# Patient Record
Sex: Male | Born: 1985
Health system: Southern US, Community
[De-identification: ages and names within clinical notes are randomized; demographics above are authoritative.]

## PROBLEM LIST (undated history)

## (undated) DIAGNOSIS — F321 Major depressive disorder, single episode, moderate: Secondary | ICD-10-CM

## (undated) HISTORY — DX: Major depressive disorder, single episode, moderate: F32.1

---

## 2008-09-15 HISTORY — PX: MANDIBLE FRACTURE SURGERY: SHX706

## 2009-02-21 ENCOUNTER — Emergency Department: Payer: Self-pay | Admitting: Emergency Medicine

## 2010-07-03 IMAGING — CT CT CERVICAL SPINE WITHOUT CONTRAST
1 series · 12 of 14 positions shown, 15 images · non-contrast
Comparison: none

REASON FOR EXAM: neck pain  syncope
COMMENTS:

[Series 5: axial · axial · 0.24mm/px · z∈[-265,-137]mm · 12 of 76 slices shown, 15 images]
[im 6/76  soft-tissue]
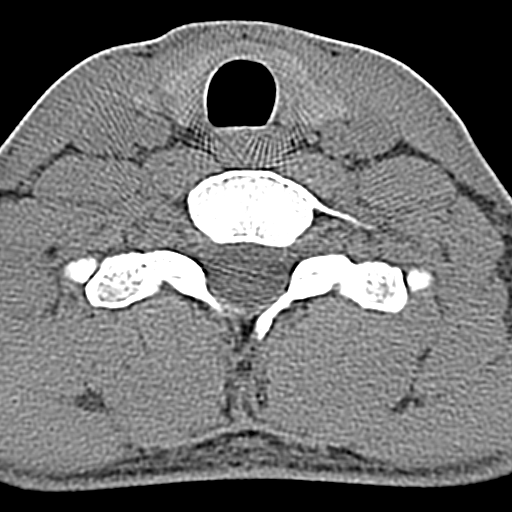
[im 6/76  bone]
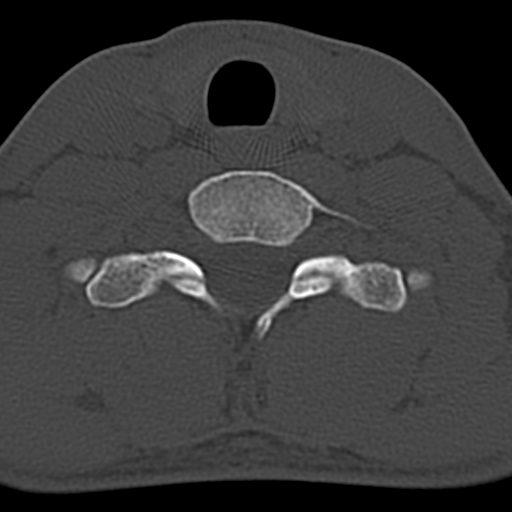
[im 12/76  bone]
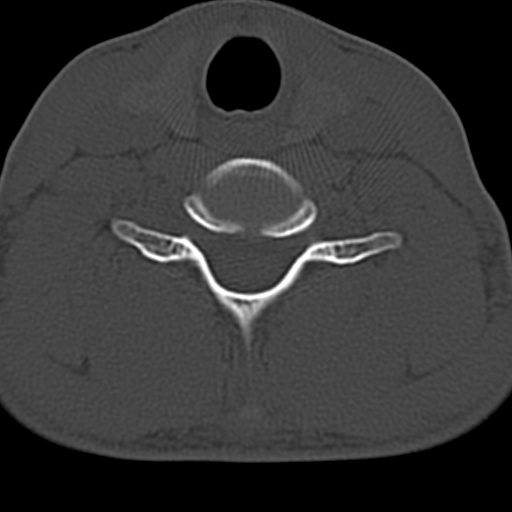
[im 18/76  bone]
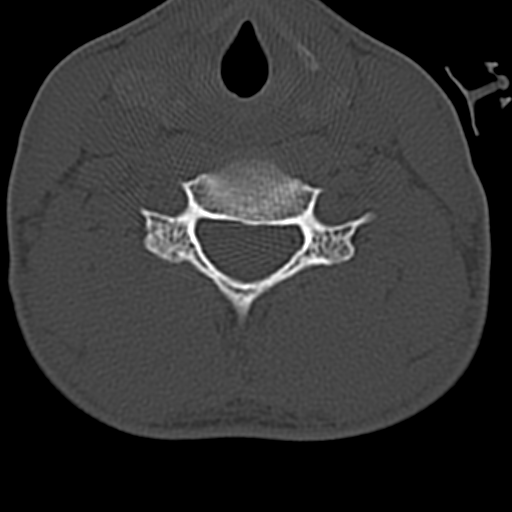
[im 24/76  bone]
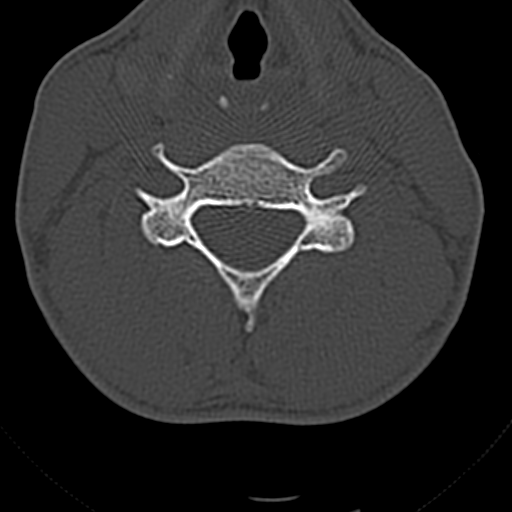
[im 29/76  soft-tissue]
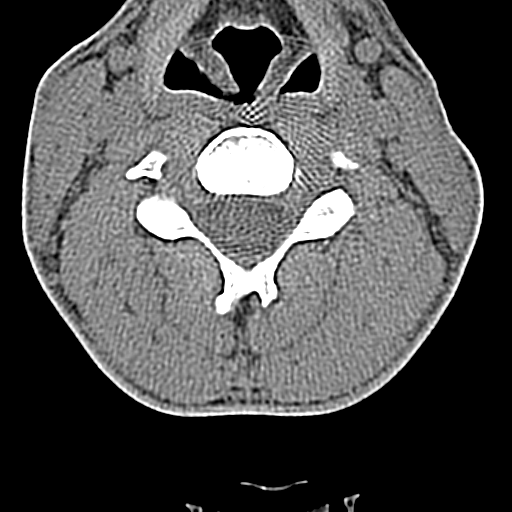
[im 29/76  bone]
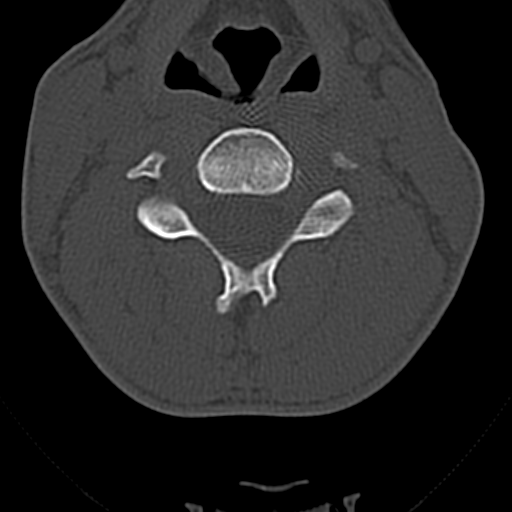
[im 35/76  bone]
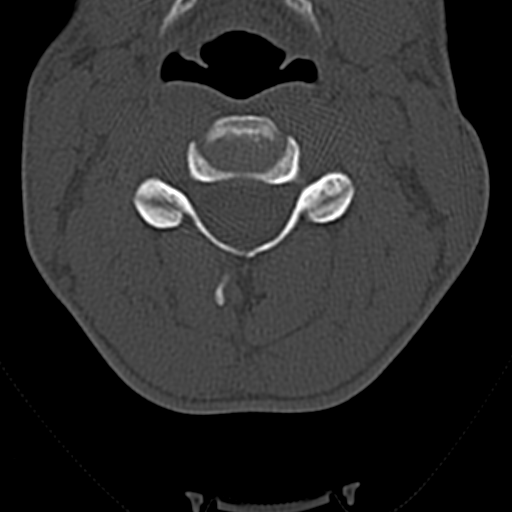
[im 41/76  bone]
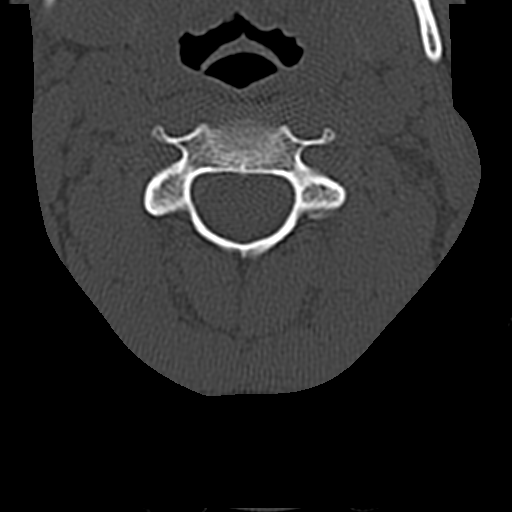
[im 47/76  bone]
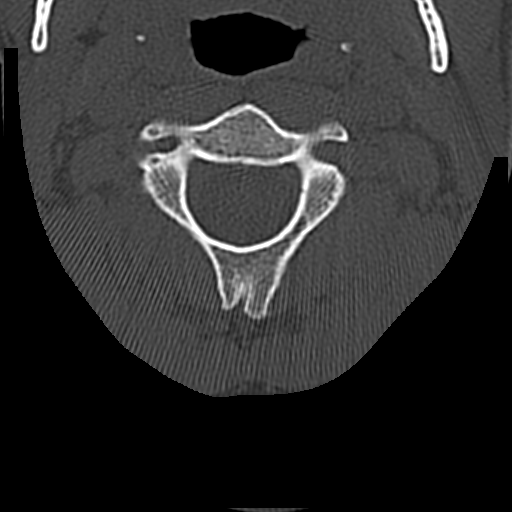
[im 52/76  soft-tissue]
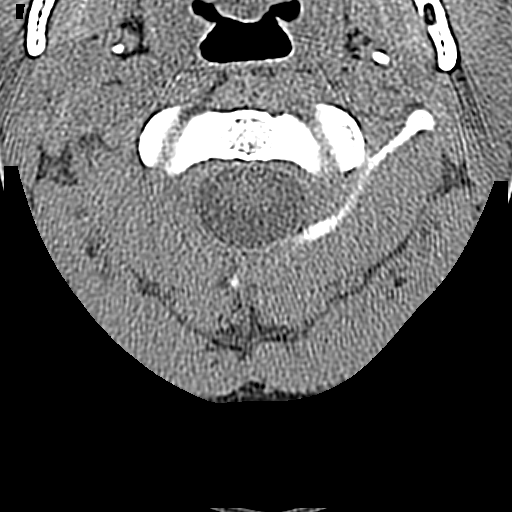
[im 52/76  bone]
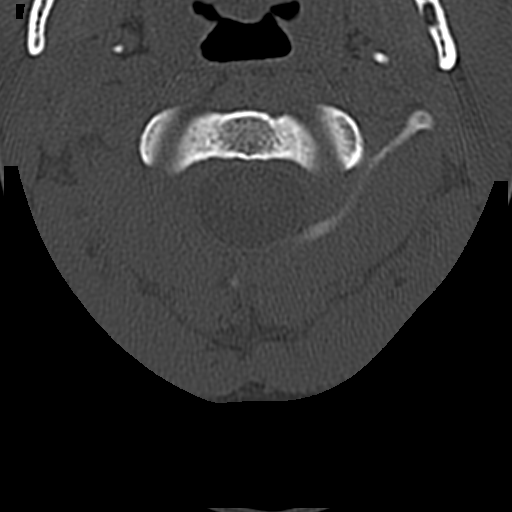
[im 58/76  bone]
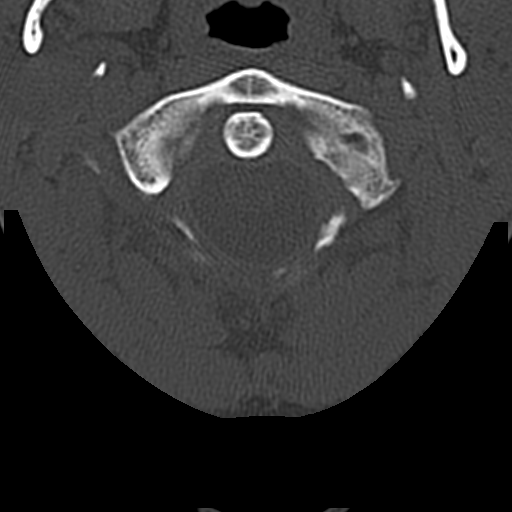
[im 64/76  bone]
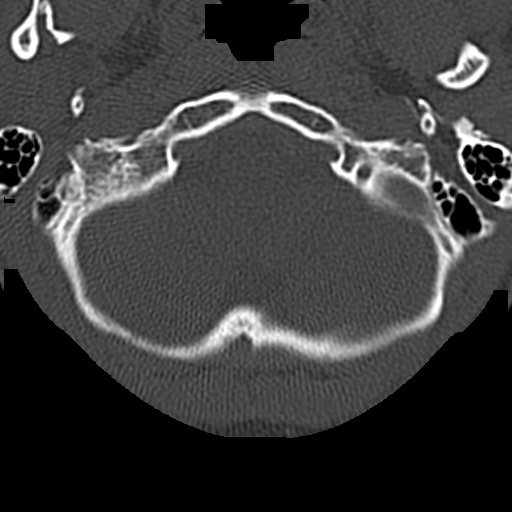
[im 70/76  bone]
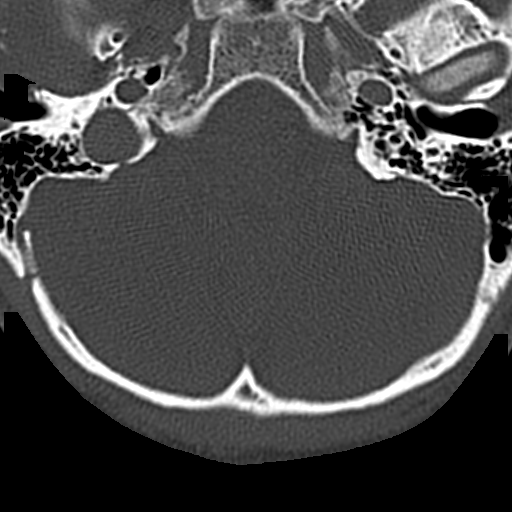

[12 of 14 positions shown; findings below may reference images not displayed]

PROCEDURE:     CT  - CT CERVICAL SPINE WO  - February 21, 2009  [DATE]

RESULT:     Multiplanar imaging of the cervical spine is obtained utilizing
helical, 2 mm sections.

There is straightening of the normal cervical lordosis likely secondary to
collar placement, muscle spasm and/or positioning. There is no evidence of
fracture, dislocation or malalignment. Note, there is incomplete fusion of
the posterior ring of C1. The borders are corticated and this is either
congenital or chronic post traumatic. There is no evidence of prevertebral
soft tissue swelling.
IMPRESSION: 1.     No evidence of acute osseous abnormalities.
2.     Dr. Gaston Antonio of the Emergency Department was informed of these
findings at the time of the initial interpretation.

## 2010-07-03 IMAGING — CT CT HEAD WITHOUT CONTRAST
1 series · 16 of 30 positions shown, 20 images · non-contrast
Comparison: none

REASON FOR EXAM: syncope
COMMENTS:

[Series 2: soft tissue · axial · 0.43mm/px · z∈[-154,-19]mm · 16 of 30 slices shown, 20 images]
[im 2/30  brain]
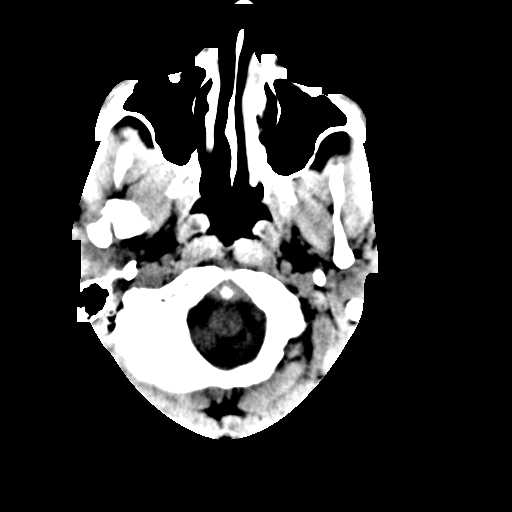
[im 2/30  bone]
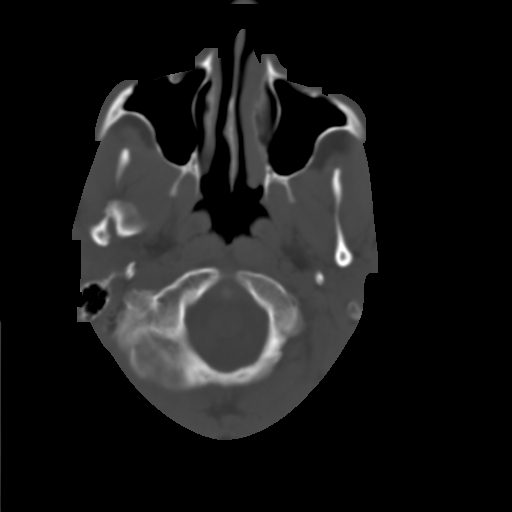
[im 4/30  brain]
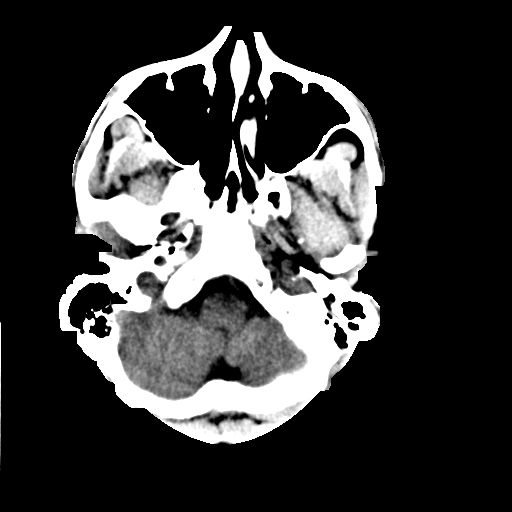
[im 6/30  brain]
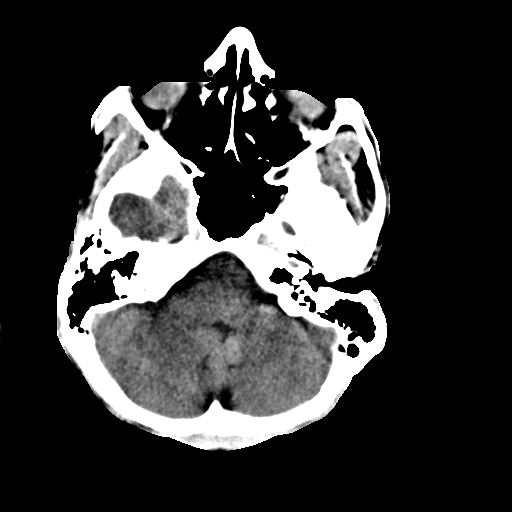
[im 8/30  brain]
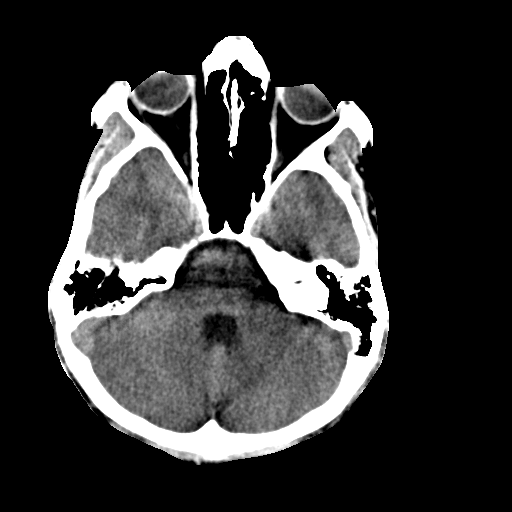
[im 9/30  brain]
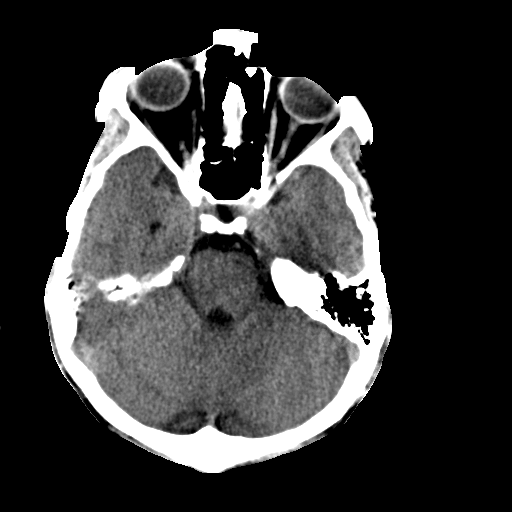
[im 9/30  bone]
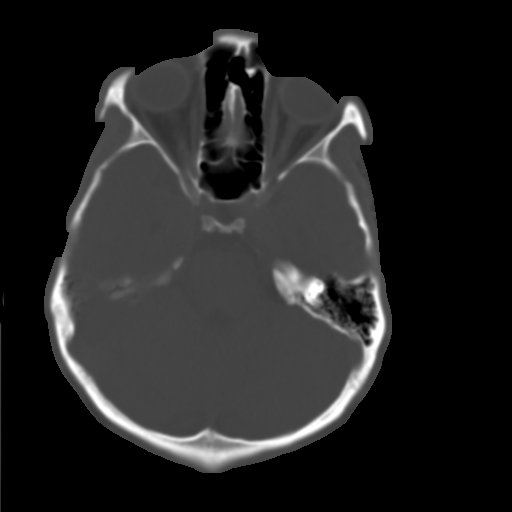
[im 11/30  brain]
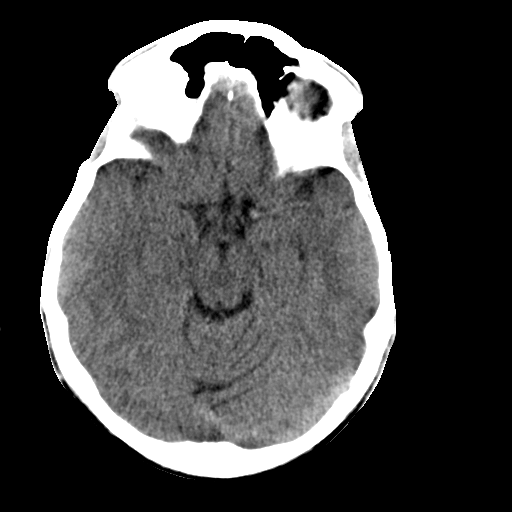
[im 13/30  brain]
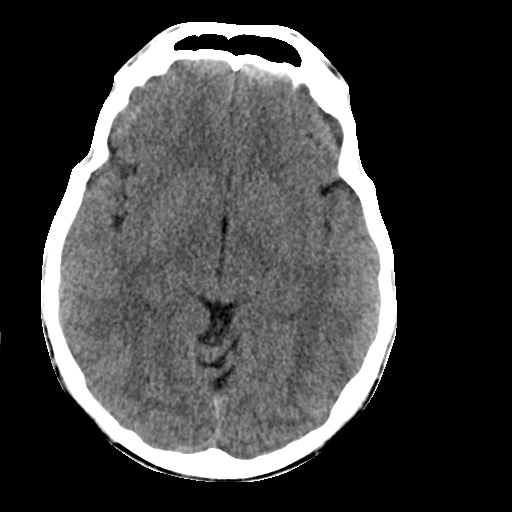
[im 15/30  brain]
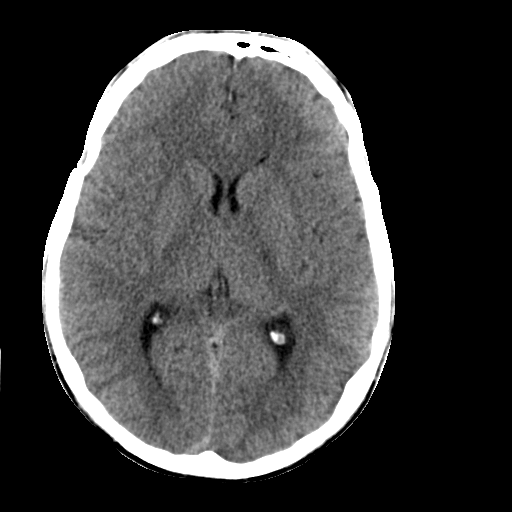
[im 16/30  brain]
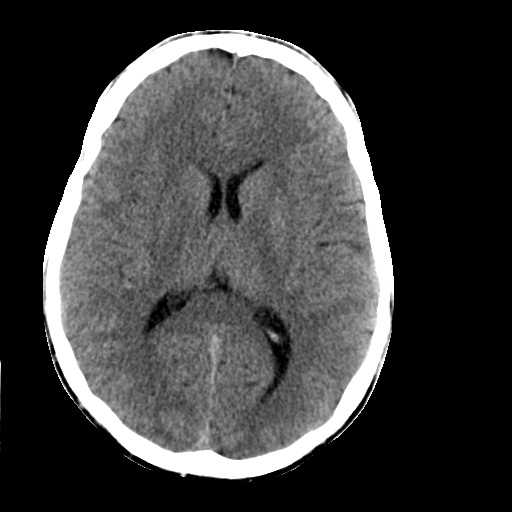
[im 16/30  bone]
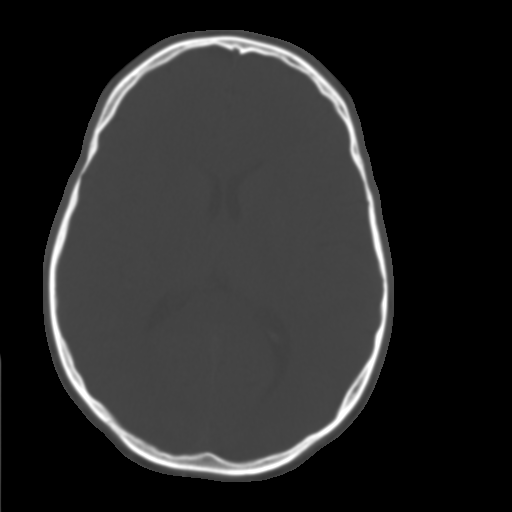
[im 18/30  brain]
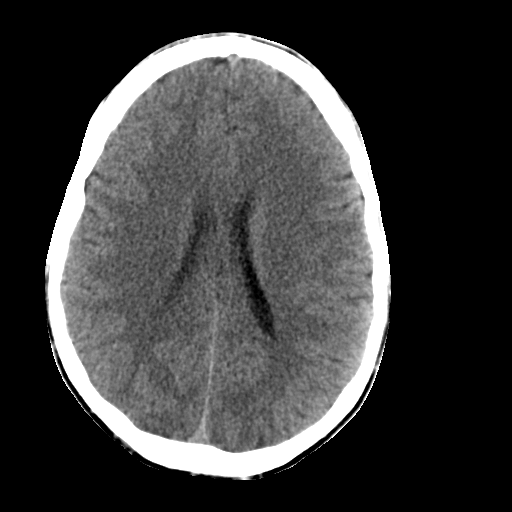
[im 20/30  brain]
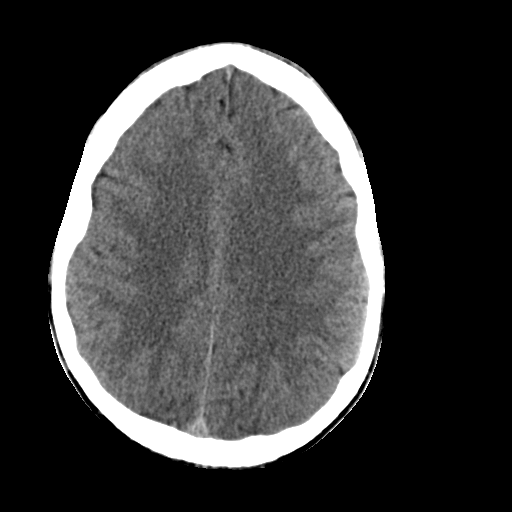
[im 22/30  brain]
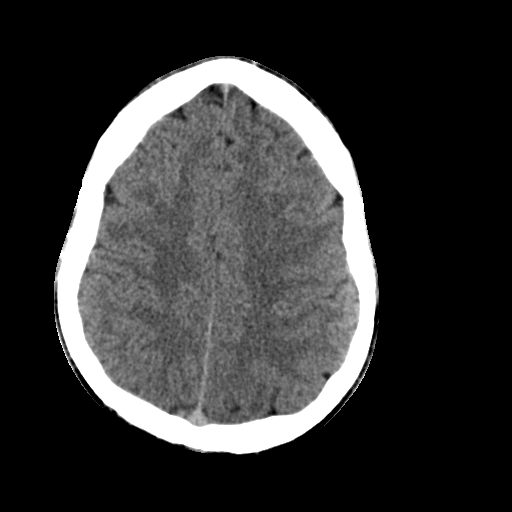
[im 23/30  brain]
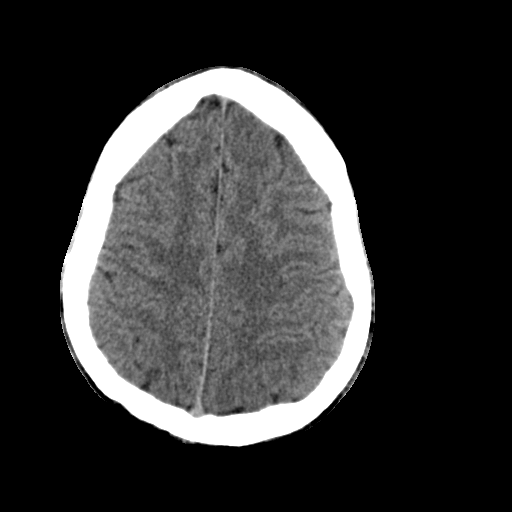
[im 23/30  bone]
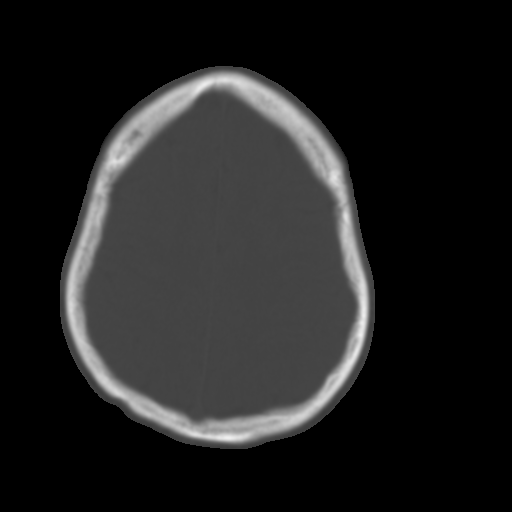
[im 25/30  brain]
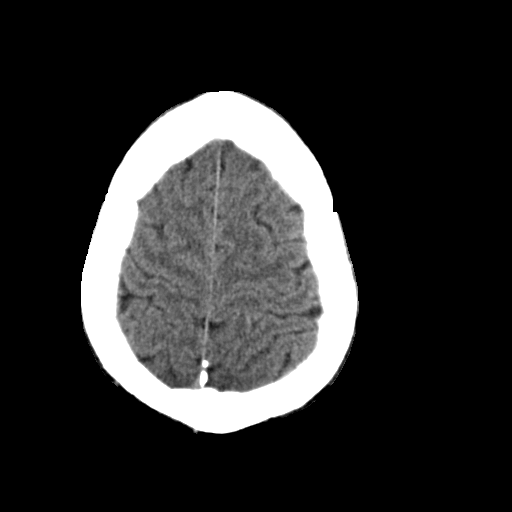
[im 27/30  brain]
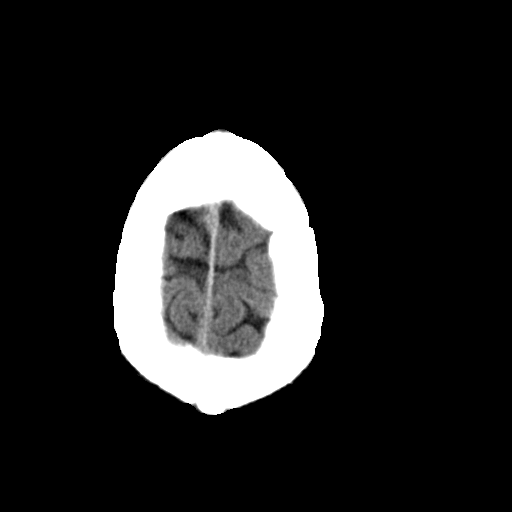
[im 29/30  brain]
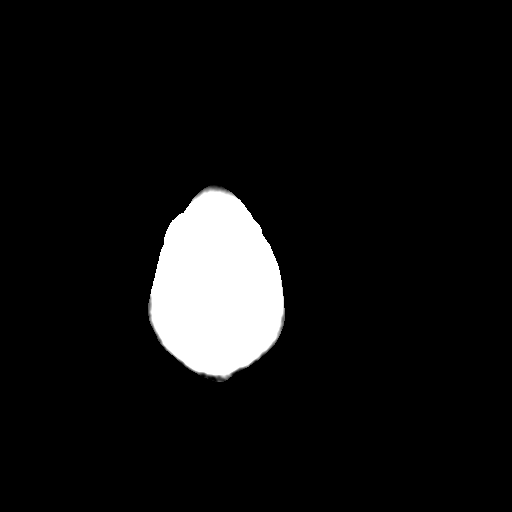

[16 of 30 positions shown; findings below may reference images not displayed]

PROCEDURE:     CT  - CT HEAD WITHOUT CONTRAST  - February 21, 2009  [DATE]

RESULT:     Helical, 5 mm sections were obtained from the skull base to the
vertex without administration of intravenous contrast.

There is no evidence of intra-axial or extra-axial fluid collections. There
is no evidence of acute hemorrhage or secondary signs reflecting mass effect
or subacute or chronic infarction. The visualized bony skeleton is evaluated
and there is no evidence of depressed skull fracture or further fracture or
dislocation. The visualized mastoid air cells are clear. The ventricles,
cisterns and sulci are symmetric and patent.  If there is persistent
clinical concern, further evaluation with brain MRI is recommended, if
clinically warranted.
IMPRESSION: 1.     No evidence of acute intracranial abnormalities as described above.
2.     Dr. Novaya of the Emergency Department was informed of these
findings at the time of the initial interpretation.

## 2017-05-27 ENCOUNTER — Ambulatory Visit: Payer: Self-pay | Admitting: Family Medicine

## 2017-08-05 ENCOUNTER — Ambulatory Visit: Payer: Self-pay | Admitting: Unknown Physician Specialty

## 2017-08-28 ENCOUNTER — Ambulatory Visit: Payer: Self-pay | Admitting: Internal Medicine

## 2017-08-31 ENCOUNTER — Ambulatory Visit: Payer: BLUE CROSS/BLUE SHIELD | Admitting: Internal Medicine

## 2017-08-31 ENCOUNTER — Encounter: Payer: Self-pay | Admitting: Internal Medicine

## 2017-08-31 VITALS — BP 138/84 | HR 89 | Ht 67.5 in | Wt 136.0 lb

## 2017-08-31 DIAGNOSIS — F321 Major depressive disorder, single episode, moderate: Secondary | ICD-10-CM

## 2017-08-31 HISTORY — DX: Major depressive disorder, single episode, moderate: F32.1

## 2017-08-31 MED ORDER — ESCITALOPRAM OXALATE 10 MG PO TABS: 10.0000 mg | ORAL_TABLET | Freq: Every day | ORAL | 2 refills | Status: AC

## 2017-08-31 NOTE — Progress Notes (Signed)
Date:  08/31/2017   Name:  John Serrano P Patel   DOB:  12/02/1985   MRN:  [id number]   Chief Complaint: Establish Care and Depression (Started a few years ago- feels down daily. Happens in spurts. One day is worse others. Never had medications for it before. PHQ9- 10) Depression         This is a chronic problem.  The current episode started more than 1 year ago.   The onset quality is undetermined.   The problem has been gradually worsening since onset.  Associated symptoms include irritable, decreased interest and sad.  Associated symptoms include no fatigue, no appetite change and no suicidal ideas.     The symptoms are aggravated by nothing.  Past treatments include psychotherapy (has been seeing a therapist for several months).  Risk factors include history of suicide attempt, history of self-injury and substance abuse.    Review of Systems  Constitutional: Negative for appetite change, chills, fatigue, fever and unexpected weight change.  Respiratory: Negative for chest tightness and shortness of breath.   Cardiovascular: Negative for chest pain and palpitations.  Gastrointestinal: Negative for abdominal pain and blood in stool.  Neurological: Negative for dizziness and syncope.  Hematological: Negative for adenopathy.  Psychiatric/Behavioral: Positive for depression and dysphoric mood. Negative for sleep disturbance and suicidal ideas. The patient is not nervous/anxious.     There are no active problems to display for this patient.   Prior to Admission medications   Not on File    Allergies  Allergen Reactions  . Penicillins     Unsure of reaction. As a child told not to take anymore.    Past Surgical History:  Procedure Laterality Date  . MANDIBLE FRACTURE SURGERY  2010    Social History   Tobacco Use  . Smoking status: Current Every Day Smoker    Packs/day: 0.25    Years: 9.00    Pack years: 2.25  . Smokeless tobacco: Never Used  Substance Use Topics  . Alcohol  use: Yes    Alcohol/week: 1.2 oz    Types: 2 Cans of beer per week  . Drug use: Yes    Types: Marijuana     Medication list has been reviewed and updated.  PHQ 2/9 Scores 08/31/2017  PHQ - 2 Score 4  PHQ- 9 Score 10    Physical Exam  Constitutional: He is oriented to person, place, and time. He appears well-developed. He is irritable. No distress.  HENT:  Head: Normocephalic and atraumatic.  Neck: Normal range of motion. Neck supple. No thyromegaly present.  Cardiovascular: Normal rate, regular rhythm and normal heart sounds.  Pulmonary/Chest: Effort normal and breath sounds normal. No respiratory distress. He has no wheezes.  Musculoskeletal: Normal range of motion. He exhibits no edema or tenderness.  Neurological: He is alert and oriented to person, place, and time.  Skin: Skin is warm and dry. No rash noted.  Psychiatric: He has a normal mood and affect. His speech is normal and behavior is normal. Thought content normal. Cognition and memory are normal.  Nursing note and vitals reviewed.   BP 138/84   Pulse 89   Ht 5' 7.5" (1.715 m)   Wt 136 lb (61.7 kg)   SpO2 99%   BMI 20.99 kg/m   Assessment and Plan: 1. Current moderate episode of major depressive disorder without prior episode (HCC) Continue to see counsellor - escitalopram (LEXAPRO) 10 MG tablet; Take 1 tablet (10 mg total) by mouth  daily.  Dispense: 30 tablet; Refill: 2   Meds ordered this encounter  Medications  . escitalopram (LEXAPRO) 10 MG tablet    Sig: Take 1 tablet (10 mg total) by mouth daily.    Dispense:  30 tablet    Refill:  2    Partially dictated using Animal nutritionistDragon software. Any errors are unintentional.  Bari EdwardLaura Bradley Handyside, MD [facility]  08/31/2017

## 2017-09-11 ENCOUNTER — Ambulatory Visit: Payer: Self-pay | Admitting: Unknown Physician Specialty

## 2017-10-12 ENCOUNTER — Ambulatory Visit: Payer: BLUE CROSS/BLUE SHIELD | Admitting: Internal Medicine

## 2017-10-12 ENCOUNTER — Encounter: Payer: Self-pay | Admitting: Internal Medicine

## 2017-10-12 VITALS — BP 104/68 | HR 71 | Ht 67.5 in | Wt 137.0 lb

## 2017-10-12 DIAGNOSIS — F324 Major depressive disorder, single episode, in partial remission: Secondary | ICD-10-CM

## 2017-10-12 NOTE — Progress Notes (Signed)
Date:  10/12/2017   Name:  John Serrano   DOB:  December 21, 1985   MRN:  [id number]   Chief Complaint: Depression (Feels better- liked medication. Family notices difference in him as well. no side affects. PHQ9- 2)  Depression         This is a new problem.  The current episode started more than 1 month ago.   The problem has been rapidly improving since onset.  Associated symptoms include no fatigue, no hopelessness, does not have insomnia, no restlessness, no decreased interest, no appetite change, no body aches, no headaches, not sad and no suicidal ideas.  Past treatments include SSRIs - Selective serotonin reuptake inhibitors.  Compliance with treatment is good.  Previous treatment provided significant relief.     Review of Systems  Constitutional: Negative for appetite change, chills, fatigue, fever and unexpected weight change.  Respiratory: Negative for cough, chest tightness, shortness of breath and wheezing.   Cardiovascular: Negative for chest pain and palpitations.  Gastrointestinal: Negative for abdominal pain.  Musculoskeletal: Negative for arthralgias, gait problem and joint swelling.  Neurological: Negative for dizziness, weakness, numbness and headaches.  Psychiatric/Behavioral: Positive for depression. Negative for dysphoric mood, sleep disturbance and suicidal ideas. The patient is not nervous/anxious and does not have insomnia.     Patient Active Problem List   Diagnosis Date Noted  . Current moderate episode of major depressive disorder without prior episode (HCC) 08/31/2017    Prior to Admission medications   Medication Sig Start Date End Date Taking? Authorizing Provider  escitalopram (LEXAPRO) 10 MG tablet Take 1 tablet (10 mg total) by mouth daily. 08/31/17  Yes Reubin MilanBerglund, Annaleigha Woo H, MD    Allergies  Allergen Reactions  . Penicillins     Unsure of reaction. As a child told not to take anymore.    Past Surgical History:  Procedure Laterality Date  .  MANDIBLE FRACTURE SURGERY  2010    Social History   Tobacco Use  . Smoking status: Current Every Day Smoker    Packs/day: 0.25    Years: 9.00    Pack years: 2.25  . Smokeless tobacco: Never Used  Substance Use Topics  . Alcohol use: Yes    Alcohol/week: 1.2 oz    Types: 2 Cans of beer per week  . Drug use: Yes    Types: Marijuana     Medication list has been reviewed and updated.  PHQ 2/9 Scores 10/12/2017 08/31/2017  PHQ - 2 Score 2 4  PHQ- 9 Score 2 10    Physical Exam  Constitutional: He is oriented to person, place, and time. He appears well-developed. No distress.  HENT:  Head: Normocephalic and atraumatic.  Neck: Normal range of motion. Neck supple. No thyromegaly present.  Cardiovascular: Normal rate, regular rhythm and normal heart sounds.  Pulmonary/Chest: Effort normal and breath sounds normal. No respiratory distress. He has no wheezes.  Musculoskeletal: Normal range of motion. He exhibits no edema.  Neurological: He is alert and oriented to person, place, and time.  Skin: Skin is warm and dry. No rash noted.  Psychiatric: He has a normal mood and affect. His behavior is normal. Thought content normal.  Nursing note and vitals reviewed.   BP 104/68   Pulse 71   Ht 5' 7.5" (1.715 m)   Wt 137 lb (62.1 kg)   SpO2 100%   BMI 21.14 kg/m   Assessment and Plan: 1. Major depressive disorder with single episode, in partial remission (HCC) Great  response to Lexapro Continue current 10 mg daily dose Follow up in 6 months or as needed   No orders of the defined types were placed in this encounter.   Partially dictated using Animal nutritionist. Any errors are unintentional.  Bari Edward, MD [facility]  10/12/2017

## 2018-04-08 ENCOUNTER — Telehealth: Payer: Self-pay

## 2018-04-08 NOTE — Telephone Encounter (Signed)
Patient called and left a VM to reschedule upcoming appt. Please call patient and reschedule.   Thank you.  - CB# [phone]

## 2018-04-09 ENCOUNTER — Telehealth: Payer: Self-pay

## 2018-04-09 NOTE — Telephone Encounter (Signed)
Please call patient to reschedule his upcoming appointment

## 2018-04-09 NOTE — Telephone Encounter (Signed)
Patient called and left a VM to reschedule upcoming appt. Please call patient and reschedule.   Thank you.  - CB# [phone]   Patient called and left a VM to reschedule upcoming appt. Please call patient and reschedule.   Thank you.  - CB# [phone]

## 2018-04-12 ENCOUNTER — Ambulatory Visit: Payer: BLUE CROSS/BLUE SHIELD | Admitting: Internal Medicine

## 2018-04-19 ENCOUNTER — Ambulatory Visit: Payer: BLUE CROSS/BLUE SHIELD | Admitting: Internal Medicine

## 2019-01-19 ENCOUNTER — Telehealth: Payer: Self-pay

## 2019-01-19 NOTE — Telephone Encounter (Signed)
Called patient to schedule an appt since its been awhile. Told him need to f/up on depression. He said he is currently laid off due to the virus and no longer has insurance so he cannot come in at this time.  Told him I will document this and if he gets back to work and has working insurance to call to schedule appt. He verbalized understanding of this.

## 2020-07-20 ENCOUNTER — Ambulatory Visit: Payer: Self-pay

## 2021-11-19 DIAGNOSIS — L309 Dermatitis, unspecified: Secondary | ICD-10-CM | POA: Diagnosis not present

## 2021-12-12 DIAGNOSIS — L218 Other seborrheic dermatitis: Secondary | ICD-10-CM | POA: Diagnosis not present

## 2022-03-05 DIAGNOSIS — I781 Nevus, non-neoplastic: Secondary | ICD-10-CM | POA: Diagnosis not present

## 2022-03-05 DIAGNOSIS — L218 Other seborrheic dermatitis: Secondary | ICD-10-CM | POA: Diagnosis not present

## 2022-04-17 ENCOUNTER — Ambulatory Visit: Payer: Self-pay | Admitting: Dermatology

## 2022-07-02 DIAGNOSIS — I781 Nevus, non-neoplastic: Secondary | ICD-10-CM | POA: Diagnosis not present

## 2022-07-02 DIAGNOSIS — L218 Other seborrheic dermatitis: Secondary | ICD-10-CM | POA: Diagnosis not present

## 2022-12-12 ENCOUNTER — Telehealth: Payer: BC Managed Care – PPO | Admitting: Physician Assistant

## 2022-12-12 DIAGNOSIS — B9689 Other specified bacterial agents as the cause of diseases classified elsewhere: Secondary | ICD-10-CM

## 2022-12-12 DIAGNOSIS — J019 Acute sinusitis, unspecified: Secondary | ICD-10-CM | POA: Diagnosis not present

## 2022-12-12 MED ORDER — DOXYCYCLINE HYCLATE 100 MG PO TABS: 100.0000 mg | ORAL_TABLET | Freq: Two times a day (BID) | ORAL | 0 refills | Status: AC

## 2022-12-12 NOTE — Patient Instructions (Signed)
[name], thank you for joining Mar Daring, PA-C for today's virtual visit.  While this provider is not your primary care provider (PCP), if your PCP is located in our provider database this encounter information will be shared with them immediately following your visit.   [facility] account gives you access to today's visit and all your visits, tests, and labs performed at [facility] " click here if you don't have a [facility] account or go to [email or web address]  Consent: (Patient) John Serrano provided verbal consent for this virtual visit at the beginning of the encounter.  Current Medications:  Current Outpatient Medications:    doxycycline (VIBRA-TABS) 100 MG tablet, Take 1 tablet (100 mg total) by mouth 2 (two) times daily., Disp: 20 tablet, Rfl: 0   escitalopram (LEXAPRO) 10 MG tablet, Take 1 tablet (10 mg total) by mouth daily., Disp: 30 tablet, Rfl: 2   Medications ordered in this encounter:  Meds ordered this encounter  Medications   doxycycline (VIBRA-TABS) 100 MG tablet    Sig: Take 1 tablet (100 mg total) by mouth 2 (two) times daily.    Dispense:  20 tablet    Refill:  0    Order Specific Question:   Supervising Provider    Answer:   Chase Picket [id number]     *If you need refills on other medications prior to your next appointment, please contact your pharmacy*  Follow-Up: Call back or seek an in-person evaluation if the symptoms worsen or if the condition fails to improve as anticipated.  [facility] [phone]  Other Instructions  Sinus Infection, Adult A sinus infection, also called sinusitis, is inflammation of your sinuses. Sinuses are hollow spaces in the bones around your face. Your sinuses are located: Around your eyes. In the middle of your forehead. Behind your nose. In your cheekbones. Mucus normally drains out of your sinuses. When your nasal tissues become inflamed or  swollen, mucus can become trapped or blocked. This allows bacteria, viruses, and fungi to grow, which leads to infection. Most infections of the sinuses are caused by a virus. A sinus infection can develop quickly. It can last for up to 4 weeks (acute) or for more than 12 weeks (chronic). A sinus infection often develops after a cold. What are the causes? This condition is caused by anything that creates swelling in the sinuses or stops mucus from draining. This includes: Allergies. Asthma. Infection from bacteria or viruses. Deformities or blockages in your nose or sinuses. Abnormal growths in the nose (nasal polyps). Pollutants, such as chemicals or irritants in the air. Infection from fungi. This is rare. What increases the risk? You are more likely to develop this condition if you: Have a weak body defense system (immune system). Do a lot of swimming or diving. Overuse nasal sprays. Smoke. What are the signs or symptoms? The main symptoms of this condition are pain and a feeling of pressure around the affected sinuses. Other symptoms include: Stuffy nose or congestion that makes it difficult to breathe through your nose. Thick yellow or greenish drainage from your nose. Tenderness, swelling, and warmth over the affected sinuses. A cough that may get worse at night. Decreased sense of smell and taste. Extra mucus that collects in the throat or the back of the nose (postnasal drip) causing a sore throat or bad breath. Tiredness (fatigue). Fever. How is this diagnosed? This condition is diagnosed based on: Your symptoms. Your medical  history. A physical exam. Tests to find out if your condition is acute or chronic. This may include: Checking your nose for nasal polyps. Viewing your sinuses using a device that has a light (endoscope). Testing for allergies or bacteria. Imaging tests, such as an MRI or CT scan. In rare cases, a bone biopsy may be done to rule out more serious  types of fungal sinus disease. How is this treated? Treatment for a sinus infection depends on the cause and whether your condition is chronic or acute. If caused by a virus, your symptoms should go away on their own within 10 days. You may be given medicines to relieve symptoms. They include: Medicines that shrink swollen nasal passages (decongestants). A spray that eases inflammation of the nostrils (topical intranasal corticosteroids). Rinses that help get rid of thick mucus in your nose (nasal saline washes). Medicines that treat allergies (antihistamines). Over-the-counter pain relievers. If caused by bacteria, your health care provider may recommend waiting to see if your symptoms improve. Most bacterial infections will get better without antibiotic medicine. You may be given antibiotics if you have: A severe infection. A weak immune system. If caused by narrow nasal passages or nasal polyps, surgery may be needed. Follow these instructions at home: Medicines Take, use, or apply over-the-counter and prescription medicines only as told by your health care provider. These may include nasal sprays. If you were prescribed an antibiotic medicine, take it as told by your health care provider. Do not stop taking the antibiotic even if you start to feel better. Hydrate and humidify  Drink enough fluid to keep your urine pale yellow. Staying hydrated will help to thin your mucus. Use a cool mist humidifier to keep the humidity level in your home above 50%. Inhale steam for 10-15 minutes, 3-4 times a day, or as told by your health care provider. You can do this in the bathroom while a hot shower is running. Limit your exposure to cool or dry air. Rest Rest as much as possible. Sleep with your head raised (elevated). Make sure you get enough sleep each night. General instructions  Apply a warm, moist washcloth to your face 3-4 times a day or as told by your health care provider. This will  help with discomfort. Use nasal saline washes as often as told by your health care provider. Wash your hands often with soap and water to reduce your exposure to germs. If soap and water are not available, use hand sanitizer. Do not smoke. Avoid being around people who are smoking (secondhand smoke). Keep all follow-up visits. This is important. Contact a health care provider if: You have a fever. Your symptoms get worse. Your symptoms do not improve within 10 days. Get help right away if: You have a severe headache. You have persistent vomiting. You have severe pain or swelling around your face or eyes. You have vision problems. You develop confusion. Your neck is stiff. You have trouble breathing. These symptoms may be an emergency. Get help right away. Call 911. Do not wait to see if the symptoms will go away. Do not drive yourself to the hospital. Summary A sinus infection is soreness and inflammation of your sinuses. Sinuses are hollow spaces in the bones around your face. This condition is caused by nasal tissues that become inflamed or swollen. The swelling traps or blocks the flow of mucus. This allows bacteria, viruses, and fungi to grow, which leads to infection. If you were prescribed an antibiotic medicine, take it  as told by your health care provider. Do not stop taking the antibiotic even if you start to feel better. Keep all follow-up visits. This is important. This information is not intended to replace advice given to you by your health care provider. Make sure you discuss any questions you have with your health care provider. Document Revised: 08/06/2021 Document Reviewed: 08/06/2021 Elsevier Patient Education  [place].    If you have been instructed to have an in-person evaluation today at a local Urgent Care facility, please use the link below. It will take you to a list of all of our available [facility], including address, phone number and  hours of operation. Please do not delay care.  [facility]  If you or a family member do not have a primary care provider, use the link below to schedule a visit and establish care. When you choose a [facility] primary care physician or advanced practice provider, you gain a long-term partner in health. Find a Primary Care Provider  Learn more about [facility] in-office and virtual care options: [facility]

## 2022-12-12 NOTE — Progress Notes (Signed)
Virtual Visit Consent   John Serrano, you are scheduled for a virtual visit with a [facility] provider today. Just as with appointments in the office, your consent must be obtained to participate. Your consent will be active for this visit and any virtual visit you may have with one of our providers in the next 365 days. If you have a MyChart account, a copy of this consent can be sent to you electronically.  As this is a virtual visit, video technology does not allow for your provider to perform a traditional examination. This may limit your provider's ability to fully assess your condition. If your provider identifies any concerns that need to be evaluated in person or the need to arrange testing (such as labs, EKG, etc.), we will make arrangements to do so. Although advances in technology are sophisticated, we cannot ensure that it will always work on either your end or our end. If the connection with a video visit is poor, the visit may have to be switched to a telephone visit. With either a video or telephone visit, we are not always able to ensure that we have a secure connection.  By engaging in this virtual visit, you consent to the provision of healthcare and authorize for your insurance to be billed (if applicable) for the services provided during this visit. Depending on your insurance coverage, you may receive a charge related to this service.  I need to obtain your verbal consent now. Are you willing to proceed with your visit today? John Serrano has provided verbal consent on 12/12/2022 for a virtual visit (video or telephone). Mar Daring, PA-C  Date: 12/12/2022 7:15 PM  Virtual Visit via Video Note   I, Mar Daring, connected with  John Serrano  ([id number], 1986/04/24) on 12/12/22 at  7:15 PM EDT by a video-enabled telemedicine application and verified that I am speaking with the correct person using two identifiers.  Location: Patient: Virtual Visit Location  Patient: Home Provider: Virtual Visit Location Provider: Home Office   I discussed the limitations of evaluation and management by telemedicine and the availability of in person appointments. The patient expressed understanding and agreed to proceed.    History of Present Illness: John Serrano is a 37 y.o. who identifies as a male who was assigned male at birth, and is being seen today for possible sinus infection.  HPI: Sinusitis This is a new problem. The current episode started 1 to 4 weeks ago. The problem has been gradually worsening since onset. There has been no fever (felt like he was running a little hot). The pain is moderate. Associated symptoms include congestion, coughing (mild), ear pain (pressure not painful), headaches, sinus pressure and a sore throat (scratchy). Pertinent negatives include no hoarse voice. (Discolored mucus) Treatments tried: zinc, orange juice, vit c, benadryl, mucinex. The treatment provided no relief.   At home covid 19 testing was negative  Problems: There are no problems to display for this patient.   Allergies:  Allergies  Allergen Reactions   Penicillins     Unsure of reaction. As a child told not to take anymore.   Medications:  Current Outpatient Medications:    doxycycline (VIBRA-TABS) 100 MG tablet, Take 1 tablet (100 mg total) by mouth 2 (two) times daily., Disp: 20 tablet, Rfl: 0   escitalopram (LEXAPRO) 10 MG tablet, Take 1 tablet (10 mg total) by mouth daily., Disp: 30 tablet, Rfl: 2  Observations/Objective: Patient is well-developed, well-nourished in  no acute distress.  Resting comfortably at home.  Head is normocephalic, atraumatic.  No labored breathing.  Speech is clear and coherent with logical content.  Patient is alert and oriented at baseline.    Assessment and Plan: 1. Acute bacterial sinusitis - doxycycline (VIBRA-TABS) 100 MG tablet; Take 1 tablet (100 mg total) by mouth 2 (two) times daily.  Dispense: 20 tablet;  Refill: 0  - Worsening symptoms that have not responded to OTC medications.  - Will give Doxycycline - Continue allergy medications.  - Steam and humidifier can help - Stay well hydrated and get plenty of rest.  - Seek in person evaluation if no symptom improvement or if symptoms worsen   Follow Up Instructions: I discussed the assessment and treatment plan with the patient. The patient was provided an opportunity to ask questions and all were answered. The patient agreed with the plan and demonstrated an understanding of the instructions.  A copy of instructions were sent to the patient via MyChart unless otherwise noted below.    The patient was advised to call back or seek an in-person evaluation if the symptoms worsen or if the condition fails to improve as anticipated.  Time:  I spent 8 minutes with the patient via telehealth technology discussing the above problems/concerns.    Mar Daring, PA-C
